# Patient Record
Sex: Female | Born: 1960 | Race: Black or African American | Hispanic: No | Marital: Single | Smoking: Former smoker
Health system: Southern US, Community
[De-identification: ages and names within clinical notes are randomized; demographics above are authoritative.]

## PROBLEM LIST (undated history)

## (undated) DIAGNOSIS — C801 Malignant (primary) neoplasm, unspecified: Secondary | ICD-10-CM

## (undated) HISTORY — PX: BRAIN SURGERY: SHX531

## (undated) HISTORY — PX: LUNG SURGERY: SHX703

---

## 2017-10-18 ENCOUNTER — Emergency Department: Payer: Medicaid Other

## 2017-10-18 ENCOUNTER — Emergency Department
Admission: EM | Admit: 2017-10-18 | Discharge: 2017-10-18 | Disposition: A | Discharge status: Home / Self Care | Payer: Medicaid Other | Attending: Emergency Medicine | Admitting: Emergency Medicine

## 2017-10-18 ENCOUNTER — Encounter: Payer: Self-pay | Admitting: Intensive Care

## 2017-10-18 ENCOUNTER — Emergency Department
Admission: EM | Admit: 2017-10-18 | Discharge: 2017-10-18 | Disposition: A | Discharge status: Home / Self Care | Payer: Medicaid Other | Source: Home / Self Care

## 2017-10-18 ENCOUNTER — Encounter: Payer: Self-pay | Admitting: Emergency Medicine

## 2017-10-18 ENCOUNTER — Other Ambulatory Visit: Payer: Self-pay

## 2017-10-18 DIAGNOSIS — C771 Secondary and unspecified malignant neoplasm of intrathoracic lymph nodes: Secondary | ICD-10-CM

## 2017-10-18 DIAGNOSIS — R109 Unspecified abdominal pain: Secondary | ICD-10-CM

## 2017-10-18 DIAGNOSIS — Z859 Personal history of malignant neoplasm, unspecified: Secondary | ICD-10-CM

## 2017-10-18 DIAGNOSIS — Z85118 Personal history of other malignant neoplasm of bronchus and lung: Secondary | ICD-10-CM | POA: Diagnosis not present

## 2017-10-18 DIAGNOSIS — K5900 Constipation, unspecified: Secondary | ICD-10-CM | POA: Insufficient documentation

## 2017-10-18 DIAGNOSIS — R1084 Generalized abdominal pain: Secondary | ICD-10-CM | POA: Diagnosis not present

## 2017-10-18 DIAGNOSIS — E860 Dehydration: Secondary | ICD-10-CM | POA: Diagnosis not present

## 2017-10-18 DIAGNOSIS — Z87891 Personal history of nicotine dependence: Secondary | ICD-10-CM | POA: Insufficient documentation

## 2017-10-18 DIAGNOSIS — C799 Secondary malignant neoplasm of unspecified site: Secondary | ICD-10-CM

## 2017-10-18 HISTORY — DX: Malignant (primary) neoplasm, unspecified: C80.1

## 2017-10-18 LAB — URINALYSIS, COMPLETE (UACMP) WITH MICROSCOPIC
BACTERIA UA: NONE SEEN
BILIRUBIN URINE: NEGATIVE
GLUCOSE, UA: NEGATIVE mg/dL
HGB URINE DIPSTICK: NEGATIVE
KETONES UR: 20 mg/dL — AB
LEUKOCYTES UA: NEGATIVE
Nitrite: NEGATIVE
Protein, ur: NEGATIVE mg/dL
Specific Gravity, Urine: 1.008 (ref 1.005–1.030)
pH: 5 (ref 5.0–8.0)

## 2017-10-18 LAB — COMPREHENSIVE METABOLIC PANEL
ALBUMIN: 4 g/dL (ref 3.5–5.0)
ALT: 11 U/L (ref 0–44)
ANION GAP: 9 (ref 5–15)
AST: 17 U/L (ref 15–41)
Alkaline Phosphatase: 52 U/L (ref 38–126)
BILIRUBIN TOTAL: 0.4 mg/dL (ref 0.3–1.2)
BUN: <5 mg/dL — ABNORMAL LOW (ref 6–20)
CO2: 29 mmol/L (ref 22–32)
Calcium: 9.7 mg/dL (ref 8.9–10.3)
Chloride: 97 mmol/L — ABNORMAL LOW (ref 98–111)
Creatinine, Ser: 0.64 mg/dL (ref 0.44–1.00)
GFR calc non Af Amer: >60 mL/min (ref >60)
GLUCOSE: 98 mg/dL (ref 70–99)
POTASSIUM: 3.8 mmol/L (ref 3.5–5.1)
SODIUM: 135 mmol/L (ref 135–145)
Total Protein: 7.3 g/dL (ref 6.5–8.1)

## 2017-10-18 LAB — CBC
HEMATOCRIT: 28.1 % — AB (ref 35.0–47.0)
HEMOGLOBIN: 9.5 g/dL — AB (ref 12.0–16.0)
MCH: 32.5 pg (ref 26.0–34.0)
MCHC: 33.9 g/dL (ref 32.0–36.0)
MCV: 95.8 fL (ref 80.0–100.0)
Platelets: 439 10*3/uL (ref 150–440)
RBC: 2.93 MIL/uL — AB (ref 3.80–5.20)
RDW: 17.2 % — ABNORMAL HIGH (ref 11.5–14.5)
WBC: 5.6 10*3/uL (ref 3.6–11.0)

## 2017-10-18 LAB — LIPASE, BLOOD: Lipase: 18 U/L (ref 11–51)

## 2017-10-18 MED ORDER — MORPHINE SULFATE (PF) 4 MG/ML IV SOLN
INTRAVENOUS | Status: AC
  Filled 2017-10-18: qty 1

## 2017-10-18 MED ORDER — LACTULOSE 10 GM/15ML PO SOLN
30.0000 g | Freq: Once | ORAL | Status: AC
  Administered 2017-10-18: 30 g via ORAL
  Filled 2017-10-18: qty 60

## 2017-10-18 MED ORDER — GI COCKTAIL ~~LOC~~
30.0000 mL | Freq: Once | ORAL | Status: AC
  Administered 2017-10-18: 30 mL via ORAL
  Filled 2017-10-18: qty 30

## 2017-10-18 MED ORDER — MORPHINE SULFATE (PF) 4 MG/ML IV SOLN
4.0000 mg | Freq: Once | INTRAVENOUS | Status: AC
  Administered 2017-10-18: 4 mg via INTRAVENOUS

## 2017-10-18 MED ORDER — LACTULOSE 10 GM/15ML PO SOLN: 20.0000 g | Freq: Every day | ORAL | 0 refills | Status: AC | PRN

## 2017-10-18 MED ORDER — MAGNESIUM CITRATE PO SOLN
1.0000 | Freq: Once | ORAL | Status: AC
  Administered 2017-10-18: 1 via ORAL
  Filled 2017-10-18: qty 296

## 2017-10-18 MED ORDER — LORAZEPAM 2 MG/ML IJ SOLN
0.5000 mg | Freq: Once | INTRAMUSCULAR | Status: AC
  Administered 2017-10-18: 0.5 mg via INTRAVENOUS
  Filled 2017-10-18: qty 1

## 2017-10-18 MED ORDER — MINERAL OIL RE ENEM
1.0000 | ENEMA | Freq: Once | RECTAL | Status: AC
  Administered 2017-10-18: 1 via RECTAL

## 2017-10-18 MED ORDER — DEXTROSE-NACL 5-0.45 % IV SOLN
INTRAVENOUS | Status: DC
  Administered 2017-10-18: 06:00:00 via INTRAVENOUS

## 2017-10-18 MED ORDER — IOPAMIDOL (ISOVUE-300) INJECTION 61%: 30.0000 mL | Freq: Once | INTRAVENOUS | Status: DC | PRN

## 2017-10-18 MED ORDER — MORPHINE SULFATE (PF) 2 MG/ML IV SOLN: 2.0000 mg | Freq: Once | INTRAVENOUS | Status: DC

## 2017-10-18 MED ORDER — ONDANSETRON HCL 4 MG/2ML IJ SOLN
4.0000 mg | Freq: Once | INTRAMUSCULAR | Status: AC
  Administered 2017-10-18: 4 mg via INTRAVENOUS
  Filled 2017-10-18: qty 2

## 2017-10-18 MED ORDER — DOCUSATE SODIUM 50 MG/5ML PO LIQD
100.0000 mg | Freq: Once | ORAL | Status: AC
  Administered 2017-10-18: 100 mg via ORAL
  Filled 2017-10-18: qty 10

## 2017-10-18 MED ORDER — IOPAMIDOL (ISOVUE-300) INJECTION 61%
100.0000 mL | Freq: Once | INTRAVENOUS | Status: AC | PRN
  Administered 2017-10-18: 100 mL via INTRAVENOUS

## 2017-10-18 MED ORDER — MORPHINE SULFATE (PF) 2 MG/ML IV SOLN
2.0000 mg | Freq: Once | INTRAVENOUS | Status: AC
  Administered 2017-10-18: 2 mg via INTRAMUSCULAR
  Filled 2017-10-18: qty 1

## 2017-10-18 MED ORDER — MORPHINE SULFATE (PF) 4 MG/ML IV SOLN
4.0000 mg | Freq: Once | INTRAVENOUS | Status: AC
  Administered 2017-10-18: 4 mg via INTRAVENOUS
  Filled 2017-10-18: qty 1

## 2017-10-18 NOTE — ED Notes (Signed)
Pt ambulated to bathroom w/ 2 assist, urinated only

## 2017-10-18 NOTE — Discharge Instructions (Addendum)
1.  You may take Lactulose as needed for bowel movements. 2.  Drink plenty of fluids daily. 3.  Return to the ER for worsening symptoms, especially worsening pain, persistent vomiting, difficulty breathing or other concerns.

## 2017-10-18 NOTE — ED Notes (Signed)
Patient reports she has had 6 small successful BMs in room

## 2017-10-18 NOTE — ED Triage Notes (Signed)
Patient still c/o abdominal pain after being seen at [facility] today. 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

## 2017-10-18 NOTE — ED Notes (Signed)
Spoke with MD again about seeing patient for abdominal cramping. Pt reported again not wanting to leave until pain under control

## 2017-10-18 NOTE — ED Notes (Signed)
Pt with large brown semi hard to soft stool production.

## 2017-10-18 NOTE — ED Provider Notes (Addendum)
[facility] Emergency Department Provider Note   ____________________________________________   First MD Initiated Contact with Patient 10/18/17 [phone]     (approximate)  I have reviewed the triage vital signs and the nursing notes.   HISTORY  Chief Complaint Abdominal Pain and Constipation    HPI Andrea Lane is a 57 y.o. female who presents to the ED from home with a chief complaint of abdominal pain secondary to constipation.  Patient has a history of lung cancer, on opioids, who states she has not had a BM in a week.  Has tried over-the-counter medicines, prune juice without success.  Denies associated fever, chills, chest pains, shortness of breath, nausea, vomiting.  Still passing gas.  Denies recent travel or trauma.   Past Medical History:  Diagnosis Date  . Cancer ([place])     There are no active problems to display for this patient.   Past Surgical History:  Procedure Laterality Date  . BRAIN SURGERY    . LUNG SURGERY      Prior to Admission medications   Medication Sig Start Date End Date Taking? Authorizing Provider  lactulose (CHRONULAC) 10 GM/15ML solution Take 30 mLs (20 g total) by mouth daily as needed for mild constipation. 10/18/17   Paulette Blanch, MD    Allergies Patient has no known allergies.  No family history on file.  Social History Social History   Tobacco Use  . Smoking status: Former Research scientist (life sciences)  . Smokeless tobacco: Never Used  Substance Use Topics  . Alcohol use: Not Currently  . Drug use: Never    Review of Systems  Constitutional: No fever/chills Eyes: No visual changes. ENT: No sore throat. Cardiovascular: Denies chest pain. Respiratory: Denies shortness of breath. Gastrointestinal: Positive for abdominal pain.  No nausea, no vomiting.  No diarrhea.  Positive for constipation. Genitourinary: Negative for dysuria. Musculoskeletal: Negative for back pain. Skin: Negative for rash. Neurological:  Negative for headaches, focal weakness or numbness.   ____________________________________________   PHYSICAL EXAM:  VITAL SIGNS: ED Triage Vitals [10/18/17 0216]  Enc Vitals Group     BP 119/64     Pulse Rate 98     Resp 18     Temp 98 F (36.7 C)     Temp Source Oral     SpO2 100 %     Weight 120 lb (54.4 kg)     Height 5\' 2"  (1.575 m)     Head Circumference      Peak Flow      Pain Score 9     Pain Loc      Pain Edu?      Excl. in [place]?     Constitutional: Alert and oriented.  Uncomfortable appearing and in mild acute distress. Eyes: Conjunctivae are normal. PERRL. EOMI. Head: Atraumatic. Nose: No congestion/rhinnorhea. Mouth/Throat: Mucous membranes are moist.  Oropharynx non-erythematous. Neck: No stridor.   Cardiovascular: Normal rate, regular rhythm. Grossly normal heart sounds.  Good peripheral circulation. Respiratory: Normal respiratory effort.  No retractions. Lungs CTAB. Gastrointestinal: Soft and mildly tender to palpation diffusely without rebound or guarding. No distention. No abdominal bruits. No CVA tenderness. Musculoskeletal: No lower extremity tenderness nor edema.  No joint effusions. Neurologic:  Normal speech and language. No gross focal neurologic deficits are appreciated. No gait instability. Skin:  Skin is warm, dry and intact. No rash noted. Psychiatric: Mood and affect are normal. Speech and behavior are normal.  ____________________________________________   LABS (all labs ordered are listed,  but only abnormal results are displayed)  Labs Reviewed  COMPREHENSIVE METABOLIC PANEL - Abnormal; Notable for the following components:      Result Value   Chloride 97 (*)    BUN <5 (*)    All other components within normal limits  CBC - Abnormal; Notable for the following components:   RBC 2.93 (*)    Hemoglobin 9.5 (*)    HCT 28.1 (*)    RDW 17.2 (*)    All other components within normal limits  URINALYSIS, COMPLETE (UACMP) WITH MICROSCOPIC  - Abnormal; Notable for the following components:   Color, Urine STRAW (*)    APPearance CLEAR (*)    Ketones, ur 20 (*)    All other components within normal limits  LIPASE, BLOOD   ____________________________________________  EKG  None ____________________________________________  RADIOLOGY  ED MD interpretation: Constipation  Official radiology report(s): No results found.  ____________________________________________   PROCEDURES  Procedure(s) performed: None  Procedures  Critical Care performed: No  ____________________________________________   INITIAL IMPRESSION / ASSESSMENT AND PLAN / ED COURSE  As part of my medical decision making, I reviewed the following data within the [place] notes reviewed and incorporated, Labs reviewed, Old chart reviewed, Radiograph reviewed  and Notes from prior ED visits   57 year old female with cancer on opiate pain medications who presents with abdominal pain secondary to constipation; no nausea or vomiting.  Differential diagnosis includes but is not limited to ileus, small bowel obstruction, gastroenteritis, colitis, etc.  Laboratory and urinalysis results demonstrate stable anemia (compared to results 2 weeks ago seen in care everywhere) and ketones in urine.  Will initiate IV fluids, 0.5 mg IV Ativan for relaxation, oral lactulose and soapsuds enema.  Clinical Course as of Oct 23 635  Nancy Fetter Oct 18, 2017  0708 Patient had small bowel movement after soapsuds enema.  Would like to eliminate more waste before discharge.  Will administer mineral oil enema.  Will discharge home with prescription for lactulose.  Care transferred to Dr. Cinda Quest pending reassessment after mineral oil enema.   [JS]  0929 Hemoglobin(!): 9.5 [PM]    Clinical Course User Index [JS] Paulette Blanch, MD [PM] Nena Polio, MD     ____________________________________________   FINAL CLINICAL IMPRESSION(S) / ED  DIAGNOSES  Final diagnoses:  Generalized abdominal pain  Constipation, unspecified constipation type  Dehydration     ED Discharge Orders         Ordered    lactulose (Joaquin) 10 GM/15ML solution  Daily PRN     10/18/17 0710           Note:  This document was prepared using Dragon voice recognition software and may include unintentional dictation errors.    Paulette Blanch, MD 10/18/17 9357    Paulette Blanch, MD 10/22/17 [phone]

## 2017-10-18 NOTE — ED Notes (Signed)
Patient transported to CT 

## 2017-10-18 NOTE — ED Notes (Signed)
Patient given update on wait time and plan of care. Patient verbalizes understanding.

## 2017-10-18 NOTE — ED Notes (Signed)
Patient transported to X-ray 

## 2017-10-18 NOTE — ED Notes (Signed)
Patient tearful telling this RN "my stomach hurts and I do not want anymore morphine. It cramps worse with it and I take morphine pills at home. I cant drink anymore of this contrast" MD made aware. Reports send patient to CT scan at 7pm. Patient asked by this RN to sip on contrast if she can until 7

## 2017-10-18 NOTE — ED Provider Notes (Signed)
[facility] Emergency Department Provider Note   ____________________________________________   None    (approximate)  I have reviewed the triage vital signs and the nursing notes.   HISTORY  Chief Complaint Abdominal Pain    HPI Andrea Lane is a 57 y.o. female patient had been seen by Dr. Beather Arbour and signed out to me.  Patient was up for discharge.  Continued to have abdominal pain.  Seem to be due to her constipation.  We got a second x-ray as she did not want to leave because of the continued pain.  This seemed to show that the stool was moving along although was still massive amounts of the present.  Discussed with her the fact that seem to be the cause of her pain.  She then agreed to leave.  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  Because of her continued bad pain which she had we had her come back to the lobby check back in again and I will CT her to make doubly sure that there is no problem with partial obstruction or something similar.  I should add that before she left she had had at least 6 more bowel movements but continued to have fairly severe crampy pain.   Past Medical History:  Diagnosis Date  . Cancer ([place])     There are no active problems to display for this patient.   Past Surgical History:  Procedure Laterality Date  . BRAIN SURGERY    . LUNG SURGERY      Prior to Admission medications   Medication Sig Start Date End Date Taking? Authorizing Provider  lactulose (CHRONULAC) 10 GM/15ML solution Take 30 mLs (20 g total) by mouth daily as needed for mild constipation. 10/18/17   Paulette Blanch, MD    Allergies Patient has no known allergies.  History reviewed. No pertinent family history.  Social History Social History   Tobacco Use  . Smoking status: Former Research scientist (life sciences)  . Smokeless tobacco: Never Used   Substance Use Topics  . Alcohol use: Not Currently  . Drug use: Never    Review of Systems  Constitutional: No fever/chills Eyes: No visual changes. ENT: No sore throat. Cardiovascular: Denies chest pain. Respiratory: Denies shortness of breath. Gastrointestinal:  abdominal pain.  At present no nausea, no vomiting.  Genitourinary: Negative for dysuria. Musculoskeletal: Negative for back pain. Skin: Negative for rash. Neurological: Negative for headaches, focal weakness   ____________________________________________   PHYSICAL EXAM:  VITAL SIGNS: ED Triage Vitals  Enc Vitals Group     BP      Pulse      Resp      Temp      Temp src      SpO2      Weight      Height      Head Circumference      Peak Flow      Pain Score      Pain Loc      Pain Edu?      Excl. in [place]?     Constitutional: Alert and oriented. Well appearing and in no acute distress but then has a severe cramp and winces. Eyes: Conjunctivae are normal. Head: Atraumatic. Nose: No congestion/rhinnorhea. Mouth/Throat: Mucous membranes are moist.  Oropharynx non-erythematous. Neck: No stridor.   Cardiovascular: Normal rate, regular  rhythm. Grossly normal heart sounds.  Good peripheral circulation. Respiratory: Normal respiratory effort.  No retractions. Lungs CTAB. Gastrointestinal: Soft and nontender palpation but as soon as I stop palpating it begins hurting.. No distention. No abdominal bruits. No CVA tenderness. Musculoskeletal: No lower extremity tenderness nor edema.   Neurologic:  Normal speech and language. No gross focal neurologic deficits are appreciated. No gait instability. Skin:  Skin is warm, dry and intact. No rash noted. Psychiatric: Mood and affect are normal. Speech and behavior are normal.  ____________________________________________   LABS (all labs ordered are listed, but only abnormal results are displayed)  Labs Reviewed - No data to  display ____________________________________________  EKG   ____________________________________________  RADIOLOGY    Official radiology report(s): Dg Abdomen 1 View  Result Date: 10/18/2017 CLINICAL DATA:  Abdominal pain and cramping. EXAM: ABDOMEN - 1 VIEW COMPARISON:  Chest and two views of the abdomen 10/18/2017. FINDINGS: The bowel gas pattern is nonobstructive. There is a massive volume of stool throughout the colon. No abnormal abdominal calcification or bony abnormality. IMPRESSION: Massive colonic stool burden.  Otherwise negative. Electronically Signed   By: Inge Rise M.D.   On: 10/18/2017 14:59   Dg Abd Acute W/chest  Result Date: 10/18/2017 CLINICAL DATA:  57 year old female with constipation and generalized abdominal pain. EXAM: DG ABDOMEN ACUTE W/ 1V CHEST COMPARISON:  None. FINDINGS: Probable mild emphysema with associated hyperexpansion of the lungs. No focal consolidation, pleural effusion, or pneumothorax. The cardiac silhouette is within normal limits. There is large amount of stool throughout the colon. No bowel dilatation or evidence of obstruction. No free air or radiopaque calculi. The osseous structures and soft tissues appear unremarkable. IMPRESSION: 1. No acute cardiopulmonary process. 2. Constipation.  No bowel obstruction. Electronically Signed   By: Anner Crete M.D.   On: 10/18/2017 04:46    ____________________________________________   PROCEDURES  Procedure(s) performed:   Procedures  Critical Care performed:   ____________________________________________   INITIAL IMPRESSION / ASSESSMENT AND PLAN / ED COURSE Will sign the patient out to Dr. Joni Fears.  He will evaluate the CT scan and see if she can go home after that.        ____________________________________________   FINAL CLINICAL IMPRESSION(S) / ED DIAGNOSES  Final diagnoses:  Abdominal pain, unspecified abdominal location     ED Discharge Orders    None        Note:  This document was prepared using Dragon voice recognition software and may include unintentional dictation errors.    Nena Polio, MD 10/18/17 1728

## 2017-10-18 NOTE — ED Notes (Signed)
Pt up to commode post enema. Pt tolerated 356mL of enema.

## 2017-10-18 NOTE — ED Triage Notes (Signed)
Patient with complaint of generalized abdominal pain 3 days. Patient reports that her last BM was about a week ago. Patient denies nausea or vomiting. Patient states that she has cancer in the lymph nodes of her stomach lining. Patient states that her last chemo was 3 weeks ago.

## 2017-10-18 NOTE — Discharge Instructions (Addendum)
Your CT scan today did not show any new issues.  Your pain appears to be similar to your prior issues with metastatic cancer and constipation. Continue taking your medications, including lactulose and miralax, and follow up with your doctor this week.

## 2017-10-18 NOTE — ED Provider Notes (Signed)
 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  Reviewed electronic medical record.  She has seen heme/onc 2 times in the last 10 days for continued management of her disease progression.  She is currently being evaluated for enrollment in a clinical trial as she has no known effective treatments available to her.  Her current abdominal pain syndrome is well described as a chronic issue for her that is worse when she is constipated.  She takes morphine and Dilaudid for pain, and lactulose and MiraLAX for constipation relief.  At this time there is no further benefit to hospitalization or further work-up in the emergency department.  I will discharge her home to follow-up with her specialists.   Carrie Mew, MD 10/18/17 2007

## 2017-10-18 NOTE — ED Notes (Signed)
Patient requesting to see MD and not be discharged yet. C/o stomach pains the same as when she came in. MD made aware

## 2017-10-18 NOTE — ED Notes (Signed)
Pt signed hard copy of E-signature for discharge

## 2017-10-18 NOTE — ED Notes (Signed)
Report from April RN

## 2017-10-18 NOTE — ED Notes (Signed)
Helped pt to the bathroom

## 2020-02-09 IMAGING — DX DG ABDOMEN 1V
1 series · 1 of 1 positions shown · non-contrast
Comparison: Chest and two views of the abdomen 10/18/2017.

CLINICAL DATA: Abdominal pain and cramping.

EXAM:
ABDOMEN - 1 VIEW

[abdomen supine]
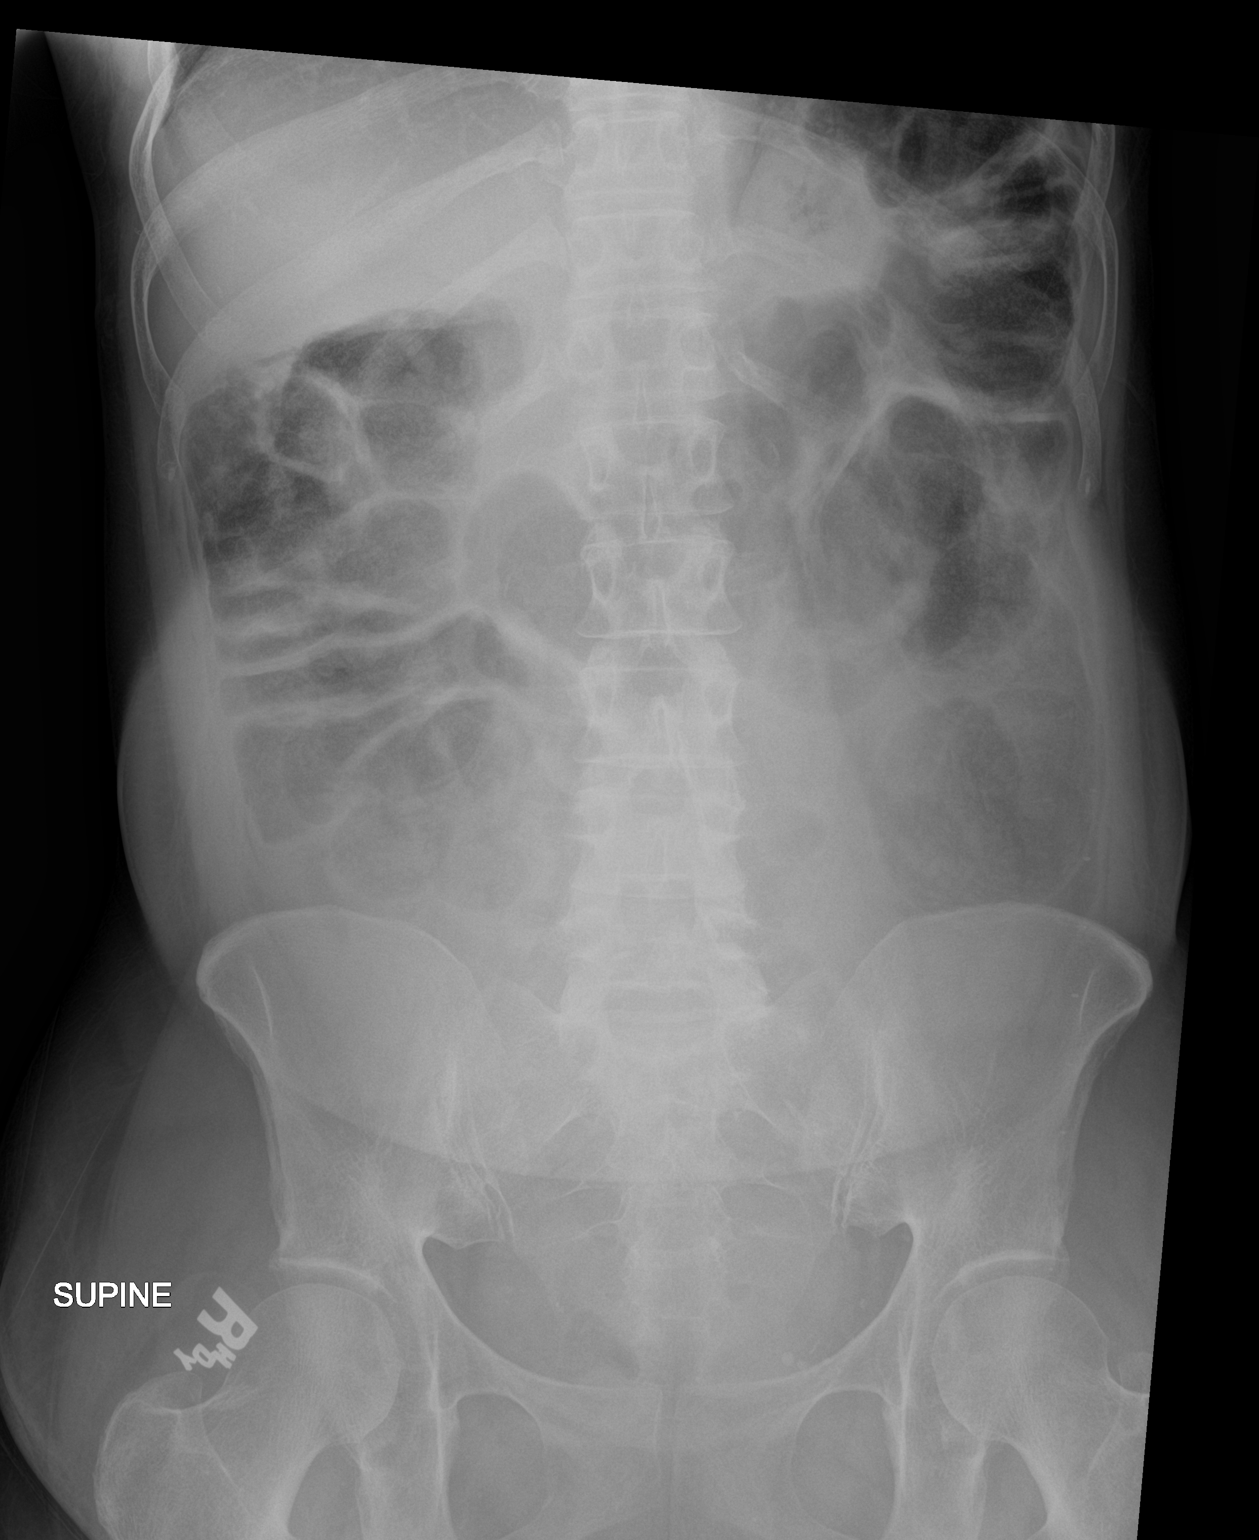

[1 of 1 positions shown; findings below may reference images not displayed]

FINDINGS: The bowel gas pattern is nonobstructive. There is a massive volume
of stool throughout the colon. No abnormal abdominal calcification
or bony abnormality.
IMPRESSION: Massive colonic stool burden.  Otherwise negative.
